# Patient Record
Sex: Female | Born: 1970 | Race: White | Hispanic: No | Marital: Single | Smoking: Former smoker
Health system: Southern US, Community
[De-identification: ages and names within clinical notes are randomized; demographics above are authoritative.]

## PROBLEM LIST (undated history)

## (undated) DIAGNOSIS — F329 Major depressive disorder, single episode, unspecified: Secondary | ICD-10-CM

## (undated) DIAGNOSIS — F32A Depression, unspecified: Secondary | ICD-10-CM

## (undated) DIAGNOSIS — F419 Anxiety disorder, unspecified: Secondary | ICD-10-CM

## (undated) HISTORY — PX: OTHER SURGICAL HISTORY: SHX169

---

## 1998-07-01 ENCOUNTER — Other Ambulatory Visit: Admission: RE | Admit: 1998-07-01 | Discharge: 1998-07-01 | Payer: Self-pay | Admitting: Obstetrics & Gynecology

## 1998-09-17 ENCOUNTER — Other Ambulatory Visit: Admission: RE | Admit: 1998-09-17 | Discharge: 1998-09-17 | Payer: Self-pay | Admitting: Obstetrics & Gynecology

## 1999-01-29 ENCOUNTER — Encounter: Payer: Self-pay | Admitting: Cardiology

## 1999-01-29 ENCOUNTER — Inpatient Hospital Stay (HOSPITAL_COMMUNITY): Admission: AD | Admit: 1999-01-29 | Discharge: 1999-01-30 | Payer: Self-pay

## 2000-03-07 ENCOUNTER — Encounter: Payer: Self-pay | Admitting: Emergency Medicine

## 2000-03-07 ENCOUNTER — Encounter: Admission: RE | Admit: 2000-03-07 | Discharge: 2000-03-07 | Payer: Self-pay | Admitting: Emergency Medicine

## 2000-07-17 ENCOUNTER — Other Ambulatory Visit: Admission: RE | Admit: 2000-07-17 | Discharge: 2000-07-17 | Payer: Self-pay | Admitting: Obstetrics and Gynecology

## 2000-08-07 ENCOUNTER — Other Ambulatory Visit: Admission: RE | Admit: 2000-08-07 | Discharge: 2000-08-07 | Payer: Self-pay | Admitting: Obstetrics and Gynecology

## 2000-08-09 ENCOUNTER — Other Ambulatory Visit: Admission: RE | Admit: 2000-08-09 | Discharge: 2000-08-09 | Payer: Self-pay | Admitting: Obstetrics and Gynecology

## 2000-08-09 ENCOUNTER — Encounter (INDEPENDENT_AMBULATORY_CARE_PROVIDER_SITE_OTHER): Payer: Self-pay | Admitting: Specialist

## 2001-04-10 ENCOUNTER — Encounter: Payer: Self-pay | Admitting: Emergency Medicine

## 2001-04-10 ENCOUNTER — Encounter: Admission: RE | Admit: 2001-04-10 | Discharge: 2001-04-10 | Payer: Self-pay | Admitting: Emergency Medicine

## 2001-08-27 ENCOUNTER — Other Ambulatory Visit: Admission: RE | Admit: 2001-08-27 | Discharge: 2001-08-27 | Payer: Self-pay | Admitting: Obstetrics and Gynecology

## 2001-11-26 ENCOUNTER — Other Ambulatory Visit: Admission: RE | Admit: 2001-11-26 | Discharge: 2001-11-26 | Payer: Self-pay | Admitting: Obstetrics and Gynecology

## 2001-11-28 ENCOUNTER — Encounter: Admission: RE | Admit: 2001-11-28 | Discharge: 2001-11-28 | Payer: Self-pay | Admitting: Obstetrics and Gynecology

## 2001-11-28 ENCOUNTER — Encounter: Payer: Self-pay | Admitting: Obstetrics and Gynecology

## 2005-04-28 ENCOUNTER — Other Ambulatory Visit: Admission: RE | Admit: 2005-04-28 | Discharge: 2005-04-28 | Payer: Self-pay | Admitting: Obstetrics and Gynecology

## 2005-11-10 ENCOUNTER — Other Ambulatory Visit: Admission: RE | Admit: 2005-11-10 | Discharge: 2005-11-10 | Payer: Self-pay | Admitting: Obstetrics and Gynecology

## 2012-04-04 ENCOUNTER — Emergency Department (HOSPITAL_BASED_OUTPATIENT_CLINIC_OR_DEPARTMENT_OTHER): Payer: 59

## 2012-04-04 ENCOUNTER — Encounter (HOSPITAL_BASED_OUTPATIENT_CLINIC_OR_DEPARTMENT_OTHER): Payer: Self-pay | Admitting: Emergency Medicine

## 2012-04-04 ENCOUNTER — Emergency Department (HOSPITAL_BASED_OUTPATIENT_CLINIC_OR_DEPARTMENT_OTHER)
Admission: EM | Admit: 2012-04-04 | Discharge: 2012-04-04 | Disposition: A | Discharge status: Home / Self Care | Payer: 59 | Attending: Emergency Medicine | Admitting: Emergency Medicine

## 2012-04-04 DIAGNOSIS — R1013 Epigastric pain: Secondary | ICD-10-CM | POA: Insufficient documentation

## 2012-04-04 DIAGNOSIS — Z87891 Personal history of nicotine dependence: Secondary | ICD-10-CM | POA: Insufficient documentation

## 2012-04-04 DIAGNOSIS — Z86718 Personal history of other venous thrombosis and embolism: Secondary | ICD-10-CM | POA: Insufficient documentation

## 2012-04-04 DIAGNOSIS — R0602 Shortness of breath: Secondary | ICD-10-CM | POA: Insufficient documentation

## 2012-04-04 LAB — BASIC METABOLIC PANEL
Calcium: 9.4 mg/dL (ref 8.4–10.5)
GFR calc Af Amer: >90 mL/min (ref >90)
GFR calc non Af Amer: >90 mL/min (ref >90)
Sodium: 141 mEq/L (ref 135–145)

## 2012-04-04 LAB — CBC
MCH: 30.6 pg (ref 26.0–34.0)
MCHC: 34.3 g/dL (ref 30.0–36.0)
Platelets: 157 10*3/uL (ref 150–400)

## 2012-04-04 LAB — TROPONIN I: Troponin I: <0.3 ng/mL (ref <0.30)

## 2012-04-04 LAB — D-DIMER, QUANTITATIVE: D-Dimer, Quant: <0.27 ug/mL-FEU (ref 0.00–0.48)

## 2012-04-04 MED ORDER — IBUPROFEN 400 MG PO TABS: 400.0000 mg | ORAL_TABLET | Freq: Four times a day (QID) | ORAL | Status: DC | PRN

## 2012-04-04 MED ORDER — IBUPROFEN 400 MG PO TABS
600.0000 mg | ORAL_TABLET | Freq: Once | ORAL | Status: AC
  Administered 2012-04-04: 600 mg via ORAL
  Filled 2012-04-04: qty 1

## 2012-04-04 NOTE — ED Notes (Signed)
Per EMS, pt picked up from work c/o Rt epigastric pain that began suddenly, sharp in character under Rt ribs that is worse w/ movement. Pt denies any cough, SHOB, N/V.  Pt reports recent stress at work. 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 20 G Lt AC. Pt on 2L O2.

## 2012-04-04 NOTE — ED Provider Notes (Signed)
History     CSN: [id number]  Arrival date & time 04/04/12  1013   First MD Initiated Contact with Patient 04/04/12 1032      Chief Complaint  Patient presents with  . Abdominal Pain    RUQ under Rt ribs    (Consider location/radiation/quality/duration/timing/severity/associated sxs/prior treatment) HPIAngela F Schroeder is a 41 y.o. female with a significant past medical history for a surface vein was thrombosed on her left rib cage for which she underwent Coumadin therapy for 6 months, this occurred about 10 years ago.  Patient was in [place] over the weekend, she drove there and back and then had the sudden onset of epigastric pain this morning. This pain is a sharp pain, was associated with some shortness of breath, it felt better when she was hunched over and hurt worse when she tried to arch her back, is worse on movement. Start up early this morning and then she went to meeting about 9:00 pain was about 6/10 at that point and now has a 4/10. It is worse on breathing and laughing. Patient denies any steroid therapy, no hemoptysis, no history of PE, no chest pain, no cough, no fevers, no chills, no nausea vomiting or diarrhea.  She's had an otherwise uneventful weekend in [place], she was site seen, she was walking around and had no pain in her calves, has no pain in her calves today. No swelling in either Either. Patient says the only thing that happened is that when he Karna Christmas turned on it lasted cold water which made her inspire deeply. History reviewed. No pertinent past medical history.  History reviewed. No pertinent past surgical history.  No family history on file.  History  Substance Use Topics  . Smoking status: Former Games developer  . Smokeless tobacco: Not on file  . Alcohol Use: Yes     Comment: occasionally    OB History    Grav Para Term Preterm Abortions TAB SAB Ect Mult Living                  Review of Systems At least 10pt or greater review of  systems completed and are negative except where specified in the HPI.  Allergies  Review of patient's allergies indicates no known allergies.  Home Medications  No current outpatient prescriptions on file.  BP 124/71  Pulse 60  Temp 97.4 F (36.3 C) (Oral)  Resp 16  Ht 5\' 1"  (1.549 m)  Wt 125 lb (56.7 kg)  BMI 23.62 kg/m2  SpO2 100%  Physical Exam  Nursing notes reviewed.  Electronic medical record reviewed. VITAL SIGNS:   Filed Vitals:   04/04/12 1018 04/04/12 1019 04/04/12 1235  BP: 124/71  117/64  Pulse: 60  58  Temp: 97.4 F (36.3 C)    TempSrc: Oral    Resp:  16 16  Height: 5\' 1"  (1.549 m)    Weight: 125 lb (56.7 kg)    SpO2: 100%  100%   CONSTITUTIONAL: Awake, oriented, appears non-toxic HENT: Atraumatic, normocephalic, oral mucosa pink and moist, airway patent. Nares patent without drainage. External ears normal. EYES: Conjunctiva clear, EOMI, PERRLA NECK: Trachea midline, non-tender, supple CARDIOVASCULAR: Normal heart rate, Normal rhythm, No murmurs, rubs, gallops PULMONARY/CHEST: Clear to auscultation, no rhonchi, wheezes, or rales. Symmetrical breath sounds. Non-tender. ABDOMINAL: Non-distended, soft, no rebound or guarding. Mild tenderness to palpation in the epigastrium when applying pressure directly inferior to the xiphoid process.  BS normal. NEUROLOGIC: Non-focal, moving all four extremities, no gross  sensory or motor deficits. EXTREMITIES: No clubbing, cyanosis, or edema. No calf tenderness to palpation. Negative Homans sign. SKIN: Warm, Dry, No erythema, No rash  ED Course  Procedures (including critical care time)  Date: 04/04/2012  Rate: 56  Rhythm: normal sinus rhythm  QRS Axis: normal  Intervals: normal  ST/T Wave abnormalities: normal  Conduction Disutrbances: none  Narrative Interpretation: unremarkable - normal sinus rhythm, no right heart strain, no ST or T wave abnormalities suggestive of ischemia or infarction    Labs Reviewed    CBC  BASIC METABOLIC PANEL  TROPONIN I  D-DIMER, QUANTITATIVE   Dg Chest 2 View  04/04/2012  *RADIOLOGY REPORT*  Clinical Data: Epigastric pain  CHEST - 2 VIEW  Comparison: None.  Findings: The heart and pulmonary vascularity are within normal limits.  The lungs are clear bilaterally.  No acute bony abnormality is noted.  IMPRESSION: No acute abnormalities seen.   Original Report Authenticated By: Alcide Clever, M.D.      1. Epigastric pain   2. Shortness of breath       MDM  Dawn Schroeder is a 41 y.o. female presenting with epigastric pain this pain seems to be in the chest wall or in the muscles attaching the xiphoid process and rib cage to the abdominal wall. I do not think this is a PE, do not think this is an atypical angina and is not suggestive of acute coronary syndrome. Patient did have a long car travel this weekend and does have a history of having a venous thrombosis treated with Coumadin, for these reasons I think it is worth to check a d-dimer to make sure that the patient does not have a PE. Again my pretest probability is low and with her negative d-dimer giving her a very low posttest probability.   Patient's EKG is unremarkable, she has a negative troponin, this chest pain started early this morning I expect some kind of drift, again my pretest probability for acute coronary syndrome in this patient is otherwise healthy is low. Chest x-ray is nonacute. Patient has no white count. Patient is much better after therapy here in the emergency department, given her stretching instruction stretch out the muscles of the abdominal wall and chest wall, she may take ibuprofen as needed for discomfort. She can followup in 2 days with her primary care physician. I explained the diagnosis and have given explicit precautions to return to the ER including worsening chest pain, persistent chest pain, chest pain associated with nausea vomiting, diaphoresis, shortness of breath or any other new  or worsening symptoms. The patient understands and accepts the medical plan as it's been dictated and I have answered their questions. Discharge instructions concerning home care and prescriptions have been given.  The patient is STABLE and is discharged to home in good condition.         Jones Skene, MD 04/04/12 1245

## 2012-05-18 ENCOUNTER — Telehealth: Payer: Self-pay | Admitting: Obstetrics and Gynecology

## 2012-05-18 NOTE — Telephone Encounter (Signed)
Tc to pt regarding msg.  Lm on vm to call back. 

## 2012-06-13 NOTE — Telephone Encounter (Signed)
No return call, encounter ended.

## 2014-06-02 ENCOUNTER — Ambulatory Visit
Admission: RE | Admit: 2014-06-02 | Discharge: 2014-06-02 | Disposition: A | Discharge status: Home / Self Care | Payer: BLUE CROSS/BLUE SHIELD | Source: Ambulatory Visit | Attending: Physician Assistant | Admitting: Physician Assistant

## 2014-06-02 ENCOUNTER — Other Ambulatory Visit: Payer: Self-pay | Admitting: Physician Assistant

## 2014-06-02 DIAGNOSIS — R52 Pain, unspecified: Secondary | ICD-10-CM

## 2017-02-24 ENCOUNTER — Other Ambulatory Visit: Payer: Self-pay | Admitting: Obstetrics and Gynecology

## 2017-03-17 NOTE — Patient Instructions (Addendum)
Your procedure is scheduled on:  Monday, 11/12  Enter through the Main Entrance of [facility] at: 7 am  Pick up the phone at the desk and dial [phone].  Call this number if you have problems the morning of surgery: [phone].  Remember: Do NOT eat food or drink clear liquids (including water) after Sunday  Take these medicines the morning of surgery with a SIP OF WATER:  Lamictal  Do NOT wear jewelry (body piercing), metal hair clips/bobby pins, make-up, or nail polish. Do NOT wear lotions, powders, or perfumes.  You may wear deoderant. Do NOT shave for 48 hours prior to surgery. Do NOT bring valuables to the hospital. Contacts may not be worn into surgery.  Leave suitcase in car.  After surgery it may be brought to your room.  For patients admitted to the hospital, checkout time is 11:00 AM the day of discharge. Have a responsible adult drive you home and stay with you for 24 hours after your procedure.  Home with Robert cell [phone].

## 2017-03-20 ENCOUNTER — Other Ambulatory Visit (HOSPITAL_COMMUNITY): Payer: BLUE CROSS/BLUE SHIELD

## 2017-03-20 ENCOUNTER — Encounter (HOSPITAL_COMMUNITY): Payer: Self-pay

## 2017-03-20 NOTE — H&P (Signed)
Dawn Schroeder is a 46 y.o. female,  P: 3-0-2-3, who presents for an anterior-posterior colporrhaphy and placement of tension free vaginal tape because of stress urinary incontinence.  Over the past year the patient has had worsening  urine leakage with coughing, laughing, lifting and bending.  She goes on to report post void dribbling and inability to hold her urine.  She denies and dysuria, history of renal stones, frequency or flank pain.  She has a Mirena IUD and therefore does not have a menstrual period.  A Lumax (urodynamic) study  ( done August 2018)  showed results consistent with stress urinary incontinence.  A review of both medical and surgical management options were given to the patient for management of her condition,  however she wishes to proceed with surgical management in the form of  an anterior-posterior colporrhaphy and placement of tension free vaginal tape .  Past Medical History  OB History: G: 5;  P: 3-0-2-3  SVB 1988, 1990 and 1988 with largest infant weighing 7 lbs. 7 ounces  GYN History: menarche: 46 YO    LMP: none due to Mirena IUD   The patient reports a past history of: gonorrhea, herpes and HPV.  Has a history of abnormal PAP smear that was treated with cryotherapy in 2002   Last PAP smear: 2018-normal  Medical History: Anemia, Migraines and Mood Disorder (treated with Lamictal)  Surgical History: 2002 Cervical Cryotherapy and 2016 Left Foot Surgery to Remove Morton's Neuroma Denies problems with anesthesia or history of blood transfusions  Family History:  COPD and Breast Cancer  Social History:  Single and employed in a warehouse;  Former smoker (quit 20 years ago) and occasional alcohol use   Medication: Lamictal 200 mg daily Multivitamin daily Mirena IUD placed 07/15/2016  No Known Allergies  Denies sensitivity to peanuts, shellfish, soy, latex or adhesives.   ROS: Admits to glasses, occasional migraine headaches but denies  vision changes, nasal  congestion, dysphagia, tinnitus, dizziness, hoarseness, cough,  chest pain, shortness of breath, nausea, vomiting, diarrhea,constipation,  urinary frequency,  dysuria, hematuria, vaginitis symptoms, pelvic pain, swelling of joints,easy bruising,  myalgias, arthralgias, skin rashes, unexplained weight loss and except as is mentioned in the history of present illness, patient's review of systems is otherwise negative.   Physical Exam  Bp:    120/70 P: 60 bpm    R: 16   Temperature: 98.5 degrees F orally  Weight: 145 lbs  Height: 5\' 1"   BMI: 27.4  Neck: supple without masses or thyromegaly Lungs: clear to auscultation Heart: regular rate and rhythm Abdomen: soft, non-tender and no organomegaly Pelvic:EGBUS- wnl; vagina-2/4 cystocele and 1/4 rectocele; uterus-normal  size, cervix without lesions or motion tenderness; adnexae-no tenderness or masses Extremities:  no clubbing, cyanosis or edema   Assesment: Stress Urinary Incontinence                       Symptomatic Cystocele and Rectocele   Disposition:  A discussion was held with patient regarding the indication for her procedure(s) along with the risks, which include but are not limited to reaction to anesthesia, damage to adjacent organs, infection, excessive bleeding and erosion of tension free vaginal tape. The patient verbalized understanding of these risks and has consented to proceed with an Draper and Placement of Tension Free Vaginal Tape at [facility] on March 27, 2017 at 8:30 a.m.  CSN# [id number]   [place] J. Florene Glen, PA-C  for Dr. Harvie Bridge.  Mancel Bale

## 2017-03-21 ENCOUNTER — Encounter (HOSPITAL_COMMUNITY)
Admission: RE | Admit: 2017-03-21 | Discharge: 2017-03-21 | Disposition: A | Discharge status: Home / Self Care | Payer: BLUE CROSS/BLUE SHIELD | Source: Ambulatory Visit | Attending: Obstetrics and Gynecology | Admitting: Obstetrics and Gynecology

## 2017-03-21 ENCOUNTER — Encounter (HOSPITAL_COMMUNITY): Payer: Self-pay

## 2017-03-21 ENCOUNTER — Other Ambulatory Visit: Payer: Self-pay

## 2017-03-21 DIAGNOSIS — N393 Stress incontinence (female) (male): Secondary | ICD-10-CM | POA: Diagnosis present

## 2017-03-21 HISTORY — DX: Major depressive disorder, single episode, unspecified: F32.9

## 2017-03-21 HISTORY — DX: Anxiety disorder, unspecified: F41.9

## 2017-03-21 HISTORY — DX: Depression, unspecified: F32.A

## 2017-03-21 LAB — CBC
HCT: 41.3 % (ref 36.0–46.0)
Hemoglobin: 13.8 g/dL (ref 12.0–15.0)
MCH: 30.3 pg (ref 26.0–34.0)
MCHC: 33.4 g/dL (ref 30.0–36.0)
MCV: 90.6 fL (ref 78.0–100.0)
PLATELETS: 183 10*3/uL (ref 150–400)
RBC: 4.56 MIL/uL (ref 3.87–5.11)
RDW: 12.5 % (ref 11.5–15.5)
WBC: 6.4 10*3/uL (ref 4.0–10.5)

## 2017-03-26 NOTE — Anesthesia Preprocedure Evaluation (Addendum)
Anesthesia Evaluation  Patient identified by MRN, date of birth, ID band Patient awake    Reviewed: Allergy & Precautions, NPO status , Patient's Chart, lab work & pertinent test results  History of Anesthesia Complications Negative for: history of anesthetic complications  Airway Mallampati: II  TM Distance: >3 FB Neck ROM: Full    Dental  (+) Dental Advisory Given   Pulmonary former smoker,    breath sounds clear to auscultation       Cardiovascular negative cardio ROS   Rhythm:Regular Rate:Normal     Neuro/Psych Anxiety Depression negative neurological ROS     GI/Hepatic negative GI ROS, Neg liver ROS,   Endo/Other  negative endocrine ROS  Renal/GU negative Renal ROS     Musculoskeletal negative musculoskeletal ROS (+)   Abdominal   Peds  Hematology negative hematology ROS (+) plt 183k   Anesthesia Other Findings   Reproductive/Obstetrics IUD                           Anesthesia Physical Anesthesia Plan  ASA: II  Anesthesia Plan: General   Post-op Pain Management:    Induction: Intravenous  PONV Risk Score and Plan: 4 or greater and Scopolamine patch - Pre-op, Dexamethasone, Ondansetron and Midazolam  Airway Management Planned: LMA  Additional Equipment:   Intra-op Plan:   Post-operative Plan:   Informed Consent: I have reviewed the patients History and Physical, chart, labs and discussed the procedure including the risks, benefits and alternatives for the proposed anesthesia with the patient or authorized representative who has indicated his/her understanding and acceptance.   Dental advisory given  Plan Discussed with: CRNA and Surgeon  Anesthesia Plan Comments: (Plan routine monitors, GA- LMA OK)        Anesthesia Quick Evaluation

## 2017-03-27 ENCOUNTER — Encounter (HOSPITAL_COMMUNITY)
Admission: RE | Disposition: A | Discharge status: Home / Self Care | Payer: Self-pay | Source: Ambulatory Visit | Attending: Obstetrics and Gynecology

## 2017-03-27 ENCOUNTER — Encounter (HOSPITAL_COMMUNITY): Payer: Self-pay

## 2017-03-27 ENCOUNTER — Ambulatory Visit (HOSPITAL_COMMUNITY): Payer: BLUE CROSS/BLUE SHIELD | Admitting: Anesthesiology

## 2017-03-27 ENCOUNTER — Observation Stay (HOSPITAL_COMMUNITY)
Admission: RE | Admit: 2017-03-27 | Discharge: 2017-03-28 | Disposition: A | Discharge status: Home / Self Care | Payer: BLUE CROSS/BLUE SHIELD | Source: Ambulatory Visit | Attending: Obstetrics and Gynecology | Admitting: Obstetrics and Gynecology

## 2017-03-27 ENCOUNTER — Other Ambulatory Visit: Payer: Self-pay

## 2017-03-27 DIAGNOSIS — N8110 Cystocele, unspecified: Secondary | ICD-10-CM | POA: Diagnosis not present

## 2017-03-27 DIAGNOSIS — F39 Unspecified mood [affective] disorder: Secondary | ICD-10-CM | POA: Insufficient documentation

## 2017-03-27 DIAGNOSIS — N393 Stress incontinence (female) (male): Secondary | ICD-10-CM | POA: Insufficient documentation

## 2017-03-27 DIAGNOSIS — Z87891 Personal history of nicotine dependence: Secondary | ICD-10-CM | POA: Insufficient documentation

## 2017-03-27 DIAGNOSIS — N8189 Other female genital prolapse: Secondary | ICD-10-CM | POA: Diagnosis present

## 2017-03-27 DIAGNOSIS — N816 Rectocele: Secondary | ICD-10-CM | POA: Diagnosis not present

## 2017-03-27 DIAGNOSIS — Z79899 Other long term (current) drug therapy: Secondary | ICD-10-CM | POA: Insufficient documentation

## 2017-03-27 HISTORY — PX: CYSTOSCOPY: SHX5120

## 2017-03-27 HISTORY — PX: ANTERIOR AND POSTERIOR REPAIR: SHX5121

## 2017-03-27 HISTORY — PX: BLADDER SUSPENSION: SHX72

## 2017-03-27 LAB — PREGNANCY, URINE: PREG TEST UR: NEGATIVE

## 2017-03-27 SURGERY — URETHROPEXY, USING TRANSVAGINAL TAPE
Anesthesia: General

## 2017-03-27 MED ORDER — MIDAZOLAM HCL 2 MG/2ML IJ SOLN
INTRAMUSCULAR | Status: AC
  Filled 2017-03-27: qty 2

## 2017-03-27 MED ORDER — ESTRADIOL 0.1 MG/GM VA CREA
TOPICAL_CREAM | VAGINAL | Status: AC
  Filled 2017-03-27: qty 42.5

## 2017-03-27 MED ORDER — PROPOFOL 10 MG/ML IV BOLUS
INTRAVENOUS | Status: AC
  Filled 2017-03-27: qty 20

## 2017-03-27 MED ORDER — MEPERIDINE HCL 25 MG/ML IJ SOLN
6.2500 mg | INTRAMUSCULAR | Status: DC | PRN
  Administered 2017-03-27: 6.25 mg via INTRAVENOUS

## 2017-03-27 MED ORDER — EPHEDRINE SULFATE 50 MG/ML IJ SOLN
INTRAMUSCULAR | Status: DC | PRN
  Administered 2017-03-27: 5 mg via INTRAVENOUS

## 2017-03-27 MED ORDER — FENTANYL CITRATE (PF) 100 MCG/2ML IJ SOLN
INTRAMUSCULAR | Status: DC | PRN
  Administered 2017-03-27: 25 ug via INTRAVENOUS
  Administered 2017-03-27 (×2): 50 ug via INTRAVENOUS
  Administered 2017-03-27: 25 ug via INTRAVENOUS
  Administered 2017-03-27: 50 ug via INTRAVENOUS

## 2017-03-27 MED ORDER — ONDANSETRON HCL 4 MG/2ML IJ SOLN
INTRAMUSCULAR | Status: DC | PRN
  Administered 2017-03-27: 4 mg via INTRAVENOUS

## 2017-03-27 MED ORDER — CEFAZOLIN SODIUM-DEXTROSE 2-4 GM/100ML-% IV SOLN
2.0000 g | INTRAVENOUS | Status: AC
  Administered 2017-03-27: 2 g via INTRAVENOUS

## 2017-03-27 MED ORDER — STERILE WATER FOR IRRIGATION IR SOLN
Status: DC | PRN
  Administered 2017-03-27: 1000 mL via INTRAVESICAL

## 2017-03-27 MED ORDER — LACTATED RINGERS IV SOLN
INTRAVENOUS | Status: DC
  Administered 2017-03-27 (×2): via INTRAVENOUS

## 2017-03-27 MED ORDER — DIPHENHYDRAMINE HCL 50 MG/ML IJ SOLN: 12.5000 mg | Freq: Four times a day (QID) | INTRAMUSCULAR | Status: DC | PRN

## 2017-03-27 MED ORDER — SCOPOLAMINE 1 MG/3DAYS TD PT72
1.0000 | MEDICATED_PATCH | Freq: Once | TRANSDERMAL | Status: DC
  Administered 2017-03-27: 1.5 mg via TRANSDERMAL

## 2017-03-27 MED ORDER — FENTANYL CITRATE (PF) 100 MCG/2ML IJ SOLN
INTRAMUSCULAR | Status: AC
  Filled 2017-03-27: qty 2

## 2017-03-27 MED ORDER — HYDROMORPHONE HCL 1 MG/ML IJ SOLN: 0.2500 mg | INTRAMUSCULAR | Status: DC | PRN

## 2017-03-27 MED ORDER — DEXAMETHASONE SODIUM PHOSPHATE 4 MG/ML IJ SOLN
INTRAMUSCULAR | Status: AC
  Filled 2017-03-27: qty 1

## 2017-03-27 MED ORDER — NALOXONE HCL 0.4 MG/ML IJ SOLN: 0.4000 mg | INTRAMUSCULAR | Status: DC | PRN

## 2017-03-27 MED ORDER — OXYCODONE-ACETAMINOPHEN 5-325 MG PO TABS
1.0000 | ORAL_TABLET | ORAL | Status: DC | PRN
  Administered 2017-03-27 – 2017-03-28 (×3): 1 via ORAL
  Filled 2017-03-27: qty 2
  Filled 2017-03-27 (×3): qty 1

## 2017-03-27 MED ORDER — PROMETHAZINE HCL 25 MG/ML IJ SOLN: 6.2500 mg | INTRAMUSCULAR | Status: DC | PRN

## 2017-03-27 MED ORDER — DEXAMETHASONE SODIUM PHOSPHATE 10 MG/ML IJ SOLN
INTRAMUSCULAR | Status: DC | PRN
  Administered 2017-03-27: 4 mg via INTRAVENOUS

## 2017-03-27 MED ORDER — KETOROLAC TROMETHAMINE 30 MG/ML IJ SOLN
INTRAMUSCULAR | Status: DC | PRN
  Administered 2017-03-27: 30 mg via INTRAVENOUS

## 2017-03-27 MED ORDER — DIPHENHYDRAMINE HCL 12.5 MG/5ML PO ELIX: 12.5000 mg | ORAL_SOLUTION | Freq: Four times a day (QID) | ORAL | Status: DC | PRN

## 2017-03-27 MED ORDER — VASOPRESSIN 20 UNIT/ML IV SOLN
INTRAVENOUS | Status: DC | PRN
  Administered 2017-03-27: 40 mL via INTRAMUSCULAR

## 2017-03-27 MED ORDER — LAMOTRIGINE 200 MG PO TABS
200.0000 mg | ORAL_TABLET | Freq: Every day | ORAL | Status: DC
  Administered 2017-03-28: 200 mg via ORAL
  Filled 2017-03-27 (×2): qty 1

## 2017-03-27 MED ORDER — EPHEDRINE 5 MG/ML INJ
INTRAVENOUS | Status: AC
  Filled 2017-03-27: qty 10

## 2017-03-27 MED ORDER — SCOPOLAMINE 1 MG/3DAYS TD PT72
MEDICATED_PATCH | TRANSDERMAL | Status: AC
  Administered 2017-03-27: 1.5 mg via TRANSDERMAL
  Filled 2017-03-27: qty 1

## 2017-03-27 MED ORDER — ONDANSETRON HCL 4 MG/2ML IJ SOLN: 4.0000 mg | Freq: Four times a day (QID) | INTRAMUSCULAR | Status: DC | PRN

## 2017-03-27 MED ORDER — MIDAZOLAM HCL 2 MG/2ML IJ SOLN: 0.5000 mg | Freq: Once | INTRAMUSCULAR | Status: DC | PRN

## 2017-03-27 MED ORDER — PROPOFOL 10 MG/ML IV BOLUS
INTRAVENOUS | Status: DC | PRN
  Administered 2017-03-27: 160 mg via INTRAVENOUS

## 2017-03-27 MED ORDER — MENTHOL 3 MG MT LOZG: 1.0000 | LOZENGE | OROMUCOSAL | Status: DC | PRN

## 2017-03-27 MED ORDER — LACTATED RINGERS IV SOLN
INTRAVENOUS | Status: DC
  Administered 2017-03-27 (×2): via INTRAVENOUS

## 2017-03-27 MED ORDER — ONDANSETRON HCL 4 MG PO TABS: 4.0000 mg | ORAL_TABLET | Freq: Three times a day (TID) | ORAL | Status: DC | PRN

## 2017-03-27 MED ORDER — ESTRADIOL 0.1 MG/GM VA CREA
TOPICAL_CREAM | VAGINAL | Status: DC | PRN
  Administered 2017-03-27: 1 via VAGINAL

## 2017-03-27 MED ORDER — MIDAZOLAM HCL 2 MG/2ML IJ SOLN
INTRAMUSCULAR | Status: DC | PRN
  Administered 2017-03-27: 2 mg via INTRAVENOUS

## 2017-03-27 MED ORDER — VASOPRESSIN 20 UNIT/ML IV SOLN
INTRAVENOUS | Status: AC
  Filled 2017-03-27: qty 1

## 2017-03-27 MED ORDER — CEFAZOLIN SODIUM-DEXTROSE 2-3 GM-%(50ML) IV SOLR
INTRAVENOUS | Status: AC
  Filled 2017-03-27: qty 50

## 2017-03-27 MED ORDER — MEPERIDINE HCL 25 MG/ML IJ SOLN
INTRAMUSCULAR | Status: AC
  Administered 2017-03-27: 6.25 mg via INTRAVENOUS
  Filled 2017-03-27: qty 1

## 2017-03-27 MED ORDER — LIDOCAINE HCL (CARDIAC) 20 MG/ML IV SOLN
INTRAVENOUS | Status: DC | PRN
  Administered 2017-03-27: 100 mg via INTRAVENOUS

## 2017-03-27 MED ORDER — HYDROMORPHONE 1 MG/ML IV SOLN
INTRAVENOUS | Status: DC
  Administered 2017-03-27: 14:00:00 via INTRAVENOUS
  Administered 2017-03-27: 0 mg via INTRAVENOUS
  Administered 2017-03-27: 0.4 mg via INTRAVENOUS
  Filled 2017-03-27 (×2): qty 25

## 2017-03-27 MED ORDER — ONDANSETRON HCL 4 MG/2ML IJ SOLN
INTRAMUSCULAR | Status: AC
  Filled 2017-03-27: qty 2

## 2017-03-27 MED ORDER — SODIUM CHLORIDE 0.9 % IJ SOLN
INTRAMUSCULAR | Status: AC
  Filled 2017-03-27: qty 100

## 2017-03-27 MED ORDER — DOCUSATE SODIUM 100 MG PO CAPS
100.0000 mg | ORAL_CAPSULE | Freq: Two times a day (BID) | ORAL | Status: DC
  Administered 2017-03-27: 100 mg via ORAL
  Filled 2017-03-27: qty 1

## 2017-03-27 MED ORDER — IBUPROFEN 600 MG PO TABS
600.0000 mg | ORAL_TABLET | Freq: Four times a day (QID) | ORAL | Status: DC | PRN
  Administered 2017-03-27 – 2017-03-28 (×2): 600 mg via ORAL
  Filled 2017-03-27 (×2): qty 1

## 2017-03-27 MED ORDER — LIDOCAINE HCL (CARDIAC) 20 MG/ML IV SOLN
INTRAVENOUS | Status: AC
  Filled 2017-03-27: qty 5

## 2017-03-27 MED ORDER — KETOROLAC TROMETHAMINE 30 MG/ML IJ SOLN
INTRAMUSCULAR | Status: AC
  Filled 2017-03-27: qty 1

## 2017-03-27 MED ORDER — SODIUM CHLORIDE 0.9% FLUSH: 9.0000 mL | INTRAVENOUS | Status: DC | PRN

## 2017-03-27 SURGICAL SUPPLY — 46 items
ADH SKN CLS APL DERMABOND .7 (GAUZE/BANDAGES/DRESSINGS) ×1
BLADE SURG 11 STRL SS (BLADE) ×1 IMPLANT
BLADE SURG 15 STRL LF C SS BP (BLADE) ×1 IMPLANT
BLADE SURG 15 STRL SS (BLADE) ×3
CANISTER SUCT 3000ML PPV (MISCELLANEOUS) ×3 IMPLANT
CATH FOLEY 2WAY SLVR  5CC 18FR (CATHETERS) ×2
CATH FOLEY 2WAY SLVR 30CC 16FR (CATHETERS) ×2 IMPLANT
CATH FOLEY 2WAY SLVR 5CC 18FR (CATHETERS) ×1 IMPLANT
CLOTH BEACON ORANGE TIMEOUT ST (SAFETY) ×3 IMPLANT
CONT PATH 16OZ SNAP LID 3702 (MISCELLANEOUS) IMPLANT
DECANTER SPIKE VIAL GLASS SM (MISCELLANEOUS) IMPLANT
DERMABOND ADVANCED (GAUZE/BANDAGES/DRESSINGS) ×2
DERMABOND ADVANCED .7 DNX12 (GAUZE/BANDAGES/DRESSINGS) ×1 IMPLANT
DRAPE SHEET LG 3/4 BI-LAMINATE (DRAPES) ×6 IMPLANT
GAUZE PACKING 2X5 YD STRL (GAUZE/BANDAGES/DRESSINGS) ×3 IMPLANT
GAUZE SPONGE 4X4 16PLY XRAY LF (GAUZE/BANDAGES/DRESSINGS) ×2 IMPLANT
GLOVE BIO SURGEON STRL SZ7.5 (GLOVE) ×3 IMPLANT
GLOVE BIOGEL PI IND STRL 7.0 (GLOVE) ×1 IMPLANT
GLOVE BIOGEL PI IND STRL 7.5 (GLOVE) ×1 IMPLANT
GLOVE BIOGEL PI INDICATOR 7.0 (GLOVE) ×2
GLOVE BIOGEL PI INDICATOR 7.5 (GLOVE) ×2
GOWN STRL REUS W/TWL LRG LVL3 (GOWN DISPOSABLE) ×12 IMPLANT
NEEDLE HYPO 22GX1.5 SAFETY (NEEDLE) ×3 IMPLANT
NS IRRIG 1000ML POUR BTL (IV SOLUTION) ×3 IMPLANT
PACK VAGINAL WOMENS (CUSTOM PROCEDURE TRAY) ×3 IMPLANT
SET CYSTO W/LG BORE CLAMP LF (SET/KITS/TRAYS/PACK) ×3 IMPLANT
SLING TRANS VAGINAL TAPE (Sling) ×2 IMPLANT
SLING UTERINE/ABD GYNECARE TVT (Sling) ×2 IMPLANT
SUT MNCRL AB 3-0 PS2 27 (SUTURE) IMPLANT
SUT MNCRL AB 4-0 PS2 18 (SUTURE) IMPLANT
SUT VIC AB 0 CT1 18XCR BRD8 (SUTURE) IMPLANT
SUT VIC AB 0 CT1 27 (SUTURE) ×3
SUT VIC AB 0 CT1 27XBRD ANBCTR (SUTURE) ×1 IMPLANT
SUT VIC AB 0 CT1 8-18 (SUTURE)
SUT VIC AB 1 CT1 36 (SUTURE) IMPLANT
SUT VIC AB 2-0 CT1 27 (SUTURE) ×12
SUT VIC AB 2-0 CT1 TAPERPNT 27 (SUTURE) ×4 IMPLANT
SUT VIC AB 2-0 SH 27 (SUTURE) ×18
SUT VIC AB 2-0 SH 27XBRD (SUTURE) ×8 IMPLANT
SUT VIC AB 3-0 SH 27 (SUTURE)
SUT VIC AB 3-0 SH 27X BRD (SUTURE) IMPLANT
TOWEL OR 17X24 6PK STRL BLUE (TOWEL DISPOSABLE) ×6 IMPLANT
TRAY FOLEY CATH SILVER 14FR (SET/KITS/TRAYS/PACK) ×3 IMPLANT
TUBING NON-CON 1/4 X 20 CONN (TUBING) ×1 IMPLANT
TUBING NON-CON 1/4 X 20' CONN (TUBING) ×1
YANKAUER SUCT BULB TIP NO VENT (SUCTIONS) ×4 IMPLANT

## 2017-03-27 NOTE — Anesthesia Postprocedure Evaluation (Signed)
Anesthesia Post Note  Patient: DYNA FIGUEREO  Procedure(s) Performed: TRANSVAGINAL TAPE (TVT) PROCEDURE (N/A ) ANTERIOR (CYSTOCELE) AND POSTERIOR REPAIR (RECTOCELE) (N/A ) CYSTOSCOPY (N/A )     Patient location during evaluation: PACU Anesthesia Type: General Level of consciousness: awake and alert, patient cooperative and oriented Pain management: pain level controlled Vital Signs Assessment: post-procedure vital signs reviewed and stable Respiratory status: spontaneous breathing, nonlabored ventilation and respiratory function stable Cardiovascular status: blood pressure returned to baseline and stable Postop Assessment: no apparent nausea or vomiting Anesthetic complications: no    Last Vitals:  Vitals:   03/27/17 1445 03/27/17 1557  BP: (!) 97/51 (!) 97/56  Pulse: 72 78  Resp: 16 16  Temp: 36.6 C 36.8 C  SpO2: 96% 100%    Last Pain:  Vitals:   03/27/17 1445  TempSrc: Oral  PainSc:    Pain Goal: Patients Stated Pain Goal: 2 (03/27/17 1400)               Darly Massi,E. Taffany Heiser

## 2017-03-27 NOTE — Anesthesia Postprocedure Evaluation (Signed)
Anesthesia Post Note  Patient: Dawn Schroeder  Procedure(s) Performed: TRANSVAGINAL TAPE (TVT) PROCEDURE (N/A ) ANTERIOR (CYSTOCELE) AND POSTERIOR REPAIR (RECTOCELE) (N/A ) CYSTOSCOPY (N/A )     Patient location during evaluation: [facility] Anesthesia Type: General Level of consciousness: awake, awake and alert, oriented and patient cooperative Pain management: pain level controlled Vital Signs Assessment: post-procedure vital signs reviewed and stable Respiratory status: spontaneous breathing, nonlabored ventilation, respiratory function stable and patient connected to nasal cannula oxygen Cardiovascular status: stable Postop Assessment: no apparent nausea or vomiting Anesthetic complications: no    Last Vitals:  Vitals:   03/27/17 1557 03/27/17 1811  BP: (!) 97/56   Pulse: 78   Resp: 16 14  Temp: 36.8 C   SpO2: 100% 100%    Last Pain:  Vitals:   03/27/17 1811  TempSrc:   PainSc: 4    Pain Goal: Patients Stated Pain Goal: 2 (03/27/17 1811)               Blake Vetrano L

## 2017-03-27 NOTE — Transfer of Care (Signed)
Immediate Anesthesia Transfer of Care Note  Patient: Dawn Schroeder  Procedure(s) Performed: TRANSVAGINAL TAPE (TVT) PROCEDURE (N/A ) ANTERIOR (CYSTOCELE) AND POSTERIOR REPAIR (RECTOCELE) (N/A ) CYSTOSCOPY (N/A )  Patient Location: PACU  Anesthesia Type:General  Level of Consciousness: awake, alert  and oriented  Airway & Oxygen Therapy: Patient Spontanous Breathing and Patient connected to nasal cannula oxygen  Post-op Assessment: Report given to RN, Post -op Vital signs reviewed and stable and Patient moving all extremities  Post vital signs: Reviewed and stable  Last Vitals:  Vitals:   03/27/17 0616  BP: 127/77  Pulse: 64  Resp: 16  Temp: 36.7 C  SpO2: 100%    Last Pain:  Vitals:   03/27/17 0616  TempSrc: Oral      Patients Stated Pain Goal: 4 (31/51/76 1607)  Complications: No apparent anesthesia complications

## 2017-03-27 NOTE — OR Nursing (Addendum)
Called MD at [facility], left message. MD returned call at [phone]. OR and staff ready to proceed with surgery.

## 2017-03-27 NOTE — Interval H&P Note (Signed)
History and Physical Interval Note:  03/27/2017 8:53 AM  Dawn Schroeder  has presented today for surgery, with the diagnosis of Female Stress Incontinence - 2.5 Hours  The various methods of treatment have been discussed with the patient and family. After consideration of risks, benefits and other options for treatment, the patient has consented to  Procedure(s): TRANSVAGINAL TAPE (TVT) PROCEDURE (N/A) ANTERIOR (CYSTOCELE) AND POSTERIOR REPAIR (RECTOCELE) (N/A) as a surgical intervention .  The patient's history has been reviewed, patient examined, no change in status, stable for surgery.  I have reviewed the patient's chart and labs.  Questions were answered to the patient's satisfaction.     [name],[name]  Correction on RN note:  Called by OR at 8:20.  Call returned at 8:23.  No changes in H&P.

## 2017-03-27 NOTE — Addendum Note (Signed)
Addendum  created 03/27/17 1924 by Raenette Rover, CRNA   Sign clinical note

## 2017-03-27 NOTE — Op Note (Addendum)
Preop Diagnosis: 1.SUI 2.Symptomatic Cystocele and Rectocele  Postop Diagnosis: 1.SUI 2.Cystocele and Rectocele  Procedure: 1.TVT 2.ANTERIOR AND POSTERIOR REPAIR 2.CYSTOSCOPY3 Anesthesia: General   Attending: Everett Graff, MD   Assistant: Earnstine Regal, PA-C  Findings: Cystocele and rectocele  Pathology: N/a  Fluids: 1500 cc  UOP: 50 cc  EBL: 099 cc  Complications: None  Procedure: The patient was taken to the operating room after the risks, benefits and alternatives were discussed with the patient, the patient verbalized understanding and consent signed and witnessed. A timeout was performed per protocol and patient identified as Dawn Schroeder and TVT/A-P Repair/Cystoscopy as the planned procedure. The patient was prepped and draped in the normal sterile fashion in the dorsal lithotomy position and the patient was placed under general anesthesia per the anesthesiologist. A weighted speculum was placed in the patient's vagina and the anterior vaginal wall was retracted using Deaver retractors. The anterior vaginal wall was injected with dilute pitressin (20u in 50 cc) and the anterior vaginal wall was then incised and dissected away from the underlying layer of tissue. The cystocele was repaired with a purse string stitch using 2-0 vicryl. The excess vaginal wall tissue was excised.  Attention was turned to the anterior vaginal wall beneath the urethra and dilute pitressin at a concentration of 20 units of pitressin in a total of 50cc of normal saline was injected in the anterior vaginal wall.  An incision was made in the anterior wall of the vagina for approximately 1cm beneath the midurethra and the underlying tissue was dissected away from the anterior vaginal wall down to the level of the lower symphysis pubis bilaterally. Attention was then turned to the mons pubis where two 5 mm incisions were made 2 fingerbreadths from the midline. The transabdominal guide was then  passed through the mons pubis incision on the patient's right down through the space of Retzius and out through the anterior vaginal wall after deflecting the rigid urethral catheter guide to the ipsilateral side. The same was done on the contralateral side. Cystoscopy was performed and no invadvertant bladder injury was noted. The bladder was drained with a Foley while deflecting the rigid urethral catheter guide to the patient's right and the mesh was attached to the transabdominal guide and elevated up through the space of Retzius and out through the incision on the mons pubis on the ipsilateral side. The same was done on the contralateral side. Cystoscopy was performed again and no inadvertant bladder injury was noted and bilateral ureters were noted to efflux without difficulty. The 41 French Foley was left in the urethra and a large Claiborne Billings was placed between the urethra and the mesh in order to leave the mesh slack beneath the midurethra.  The anterior vaginal wall was repaired with 2-0 vicry interrupted stitches beneath the urethra and an interlocking stitch of 2-0 vicryl over the bladder.  Attention was then turned to the posterior vaginal wall where dilute pitressin was injected. The posterior vaginal wall was incised and the underlying tissue was dissected away from the posterior vaginal wall. The rectocele was repaired using plication stitches of 2-0 Vicryl. Excess posterior vaginal wall tissue was excised and the posterior vaginal wall repaired with 2-0 vicryl via a running interlocking stitch. The perineum was repaired with 2-0 Vicryl via a subcuticular stitch.  The vagina was packed with estrogen-soaked packing. The patient tolerated the procedure well and was awaiting return to the recovery room in good condition.

## 2017-03-27 NOTE — Anesthesia Procedure Notes (Signed)
Procedure Name: LMA Insertion Date/Time: 03/27/2017 9:20 AM Performed by: Hewitt Blade, CRNA Pre-anesthesia Checklist: Patient identified, Emergency Drugs available, Suction available and Patient being monitored Patient Re-evaluated:Patient Re-evaluated prior to induction Oxygen Delivery Method: Circle system utilized Preoxygenation: Pre-oxygenation with 100% oxygen Induction Type: IV induction LMA: LMA inserted LMA Size: 3.0 Number of attempts: 1 Placement Confirmation: positive ETCO2 and breath sounds checked- equal and bilateral Tube secured with: Tape Dental Injury: Teeth and Oropharynx as per pre-operative assessment

## 2017-03-28 DIAGNOSIS — N8110 Cystocele, unspecified: Secondary | ICD-10-CM | POA: Diagnosis not present

## 2017-03-28 LAB — CBC
HEMATOCRIT: 32.6 % — AB (ref 36.0–46.0)
HEMOGLOBIN: 10.6 g/dL — AB (ref 12.0–15.0)
MCH: 30.4 pg (ref 26.0–34.0)
MCHC: 32.5 g/dL (ref 30.0–36.0)
MCV: 93.4 fL (ref 78.0–100.0)
Platelets: 172 10*3/uL (ref 150–400)
RBC: 3.49 MIL/uL — AB (ref 3.87–5.11)
RDW: 12.5 % (ref 11.5–15.5)
WBC: 11.3 10*3/uL — ABNORMAL HIGH (ref 4.0–10.5)

## 2017-03-28 MED ORDER — IBUPROFEN 600 MG PO TABS: ORAL_TABLET | ORAL | 1 refills | Status: DC

## 2017-03-28 MED ORDER — DOCUSATE SODIUM 100 MG PO CAPS
100.0000 mg | ORAL_CAPSULE | Freq: Two times a day (BID) | ORAL | Status: DC
  Administered 2017-03-28: 100 mg via ORAL
  Filled 2017-03-28: qty 1

## 2017-03-28 MED ORDER — LACTATED RINGERS IV SOLN: INTRAVENOUS | Status: DC

## 2017-03-28 MED ORDER — CIPROFLOXACIN HCL 250 MG PO TABS: 250.0000 mg | ORAL_TABLET | Freq: Two times a day (BID) | ORAL | 0 refills | Status: AC

## 2017-03-28 MED ORDER — ONDANSETRON HCL 4 MG PO TABS: 4.0000 mg | ORAL_TABLET | Freq: Three times a day (TID) | ORAL | Status: DC | PRN

## 2017-03-28 MED ORDER — ESTROGENS, CONJUGATED 0.625 MG/GM VA CREA: 0.5000 g | TOPICAL_CREAM | VAGINAL | 0 refills | Status: AC

## 2017-03-28 MED ORDER — OXYCODONE-ACETAMINOPHEN 5-325 MG PO TABS: ORAL_TABLET | ORAL | 0 refills | Status: DC

## 2017-03-28 NOTE — Progress Notes (Signed)
Vaginal packing removed atraumatically. Patient tolerated well. Vaginal tape noted to be less than 50% saturated along it's entire length. Small amount of vaginal bleeding noted after removal of packing. Pericare with assist up to BR. Pt washing off. Encouraged to wait until after breakfast to shower.

## 2017-03-28 NOTE — Progress Notes (Addendum)
Dawn Schroeder is a46 y.o.  [id number]  Post Op Date # 1:  Anterior-Posterior Colporrhaphy and Placement of Tension Free Vaginal Tape  Subjective: Patient is Doing well postoperatively. Patient has Pain is controlled with current analgesics. Medications being used: prescription NSAID's including Ibuprofen 600 mg and narcotic analgesics including Percocet 5/325 mg.. Ambulating in the halls without difficulty, tolerating a regular diet but hasn't voided since Foley was removed 1.5 hours ago.   Objective: Vital signs in last 24 hours: Temp:  [97.5 F (36.4 C)-98.3 F (36.8 C)] 98.3 F (36.8 C) (11/13 0409) Pulse Rate:  [62-78] 68 (11/13 0409) Resp:  [12-20] 16 (11/13 0409) BP: (84-107)/(45-63) 94/46 (11/13 0409) SpO2:  [95 %-100 %] 97 % (11/13 0409) Weight:  [145 lb 6 oz (65.9 kg)] 145 lb 6 oz (65.9 kg) (11/12 1245)  Intake/Output from previous day: 11/12 0701 - 11/13 0700 In: 3000 [I.V.:3000] Out: 2125 [Urine:2000] Intake/Output this shift: No intake/output data recorded. Recent Labs  Lab 03/21/17 0825  WBC 6.4  HGB 13.8  HCT 41.3  PLT 183    No results for input(s): NA, K, CL, CO2, BUN, CREATININE, CALCIUM, PROT, BILITOT, ALKPHOS, ALT, AST, GLUCOSE in the last 168 hours.  Invalid input(s): LABALBU  EXAM: General: alert, cooperative, no distress and sitting in chair. Resp: clear to auscultation bilaterally Cardio: regular rate and rhythm, S1, S2 normal, no murmur, click, rub or gallop GI: Bowel sounds present, soft Extremities: Homans sign is negative, no sign of DVT and no calf tenderness. Vaginal Bleeding: minimal and perineal pad with faint dry diffuse stain.   Assessment: s/p Procedure(s): TRANSVAGINAL TAPE (TVT) PROCEDURE ANTERIOR (CYSTOCELE) AND POSTERIOR REPAIR (RECTOCELE) CYSTOSCOPY: stable, progressing well and tolerating diet  Plan: Awaiting patient to void  Routine Care Possible discharge home today  LOS: 0 days    POWELL,ELMIRA, PA-C 03/28/2017  7:35 AM  Voiding spont without difficulty and feels like she is emptying.  Tolerating reg diet and ambulating without difficulty. D/C instructions reviewed.  F/u in 6wks.

## 2017-03-28 NOTE — Discharge Summary (Signed)
Physician Discharge Summary  Patient ID: Dawn Schroeder MRN: [id number] DOB/AGE: [birth year] 46 y.o.  Admit date: 03/27/2017 Discharge date: 03/28/2017   Discharge Diagnoses:  Stress Urinary Incontinence, Symptomatic Cystocele and Rectocele Active Problems:   Pelvic relaxation   Operation: Anterior-Posterior Colporrhaphy with Placement of Tension Free Vaginal Tape   Discharged Condition: Good  Hospital Course: On the date of admission the patient underwent the aforementioned procedures and tolerated them well.  Post operative course was unremarkable with the patient resuming bowel and bladder function by post operative day #1 and was therefore deemed ready for discharge home.  Discharge hemoglobin was:  Disposition: 01-Home or Self Care  Discharge Medications:  Allergies as of 03/28/2017   No Known Allergies     Medication List    TAKE these medications   ibuprofen 600 MG tablet Commonly known as:  ADVIL,MOTRIN 1 po pc every 6 hours for 5 days then as needed for pain What changed:    medication strength  how much to take  how to take this  when to take this  reasons to take this  additional instructions  Another medication with the same name was removed. Continue taking this medication, and follow the directions you see here.   lamoTRIgine 200 MG tablet Commonly known as:  LAMICTAL   levonorgestrel 20 MCG/24HR IUD Commonly known as:  MIRENA 1 each once by Intrauterine route. Mirena IUD inserted 09/2016   oxyCODONE-acetaminophen 5-325 MG tablet Commonly known as:  PERCOCET/ROXICET 1  po every 6 hours as needed for post operative pain   Premarin Vaginal Cream as prescribed Ciprofloxacin 250mg  every 12hrs for 3 days     Follow-up: Dr. Harvie Bridge. Mancel Bale on April 10, 2017 at 8:45 a.m.     SignedEarnstine Regal, PA-C 03/28/2017, 7:54 AM

## 2017-03-28 NOTE — Discharge Instructions (Signed)
Anterior and Posterior Colporrhaphy and Sling Procedure, Care After This sheet gives you information about how to care for yourself after your procedure. Your health care provider may also give you more specific instructions. If you have problems or questions, contact your health care provider. What can I expect after the procedure? After the procedure, it is common to have:  Pain in the surgical area.  Vaginal discharge. You will need to use a sanitary pad during this time.  Fatigue.  Follow these instructions at home: Incision care  Follow instructions from your health care provider about how to take care of your incision. Make sure you: ? Wash your hands with soap and water before touching the incision area. If soap and water are not available, use hand sanitizer. ? Clean your incision as told by your health care provider. ? Leave stitches (sutures), skin glue, or adhesive strips in place. These skin closures may need to stay in place for 2 weeks or longer. If adhesive strip edges start to loosen and curl up, you may trim the loose edges. Do not remove adhesive strips completely unless your health care provider tells you to do that.  Check your incision area every day for signs of infection. Check for: ? Redness, swelling, or pain. ? Fluid or blood. ? Warmth. ? Pus or a bad smell.  Check your incision every day to make sure the incision area is not separating or opening.  Do not take baths, swim, or use a hot tub until your health care provider approves. You may shower.  Keep the area between your vagina and rectum (perineal area) clean and dry. Make sure you clean the area after each bowel movement and each time you urinate.  Ask your health care provider if you can take a sitz bath or sit in a tub of clean, warm water. Activity  Do gentle, daily activity as told by your health care provider. You may be told to take short walks every day and go farther each time. Ask your health  care provider what activities are safe for you.  Limit stair climbing to once or twice a day in the first week, then slowly increase this activity.  Do not lift anything that is heavier than 10 lbs. (4.5 kg), or the limit that your health care provider tells you, until he or she says that it is safe. Avoid pushing or pulling motions.  Avoid standing for long periods of time.  Do not douche, use tampons, or have sex until your health care provider says it is okay.  Do not drive or use heavy machinery while taking prescription pain medicine. To prevent constipation  To prevent or treat constipation while you are taking prescription pain medicine, your health care provider may recommend that you: ? Take over-the-counter or prescription medicines. ? Eat foods that are high in fiber, such as fresh fruits and vegetables, whole grains, and beans. ? Drink enough fluid to keep your urine clear or pale yellow. ? Limit foods that are high in fat and processed sugars, such as fried and sweet foods. General instructions  You may be instructed to do pelvic floor exercises (kegels) as told by your health care provider.  Take over-the-counter and prescription medicines only as told by your health care provider.  Keep all follow-up visits as told by your health care provider. This is important. Contact a health care provider if:  Medicine does not help your pain.  You have frequent or urgent urination, or  you are unable to completely empty your bladder.  You feel a burning sensation when urinating.  You have fluid or blood coming from your incision.  You have pus or a bad smell coming from the incision.  Your incision feels warm to the touch.  You have redness, swelling, or pain around your incision. Get help right away if:  You have a fever or chills.  Your incision separates or opens.  You cannot urinate.  You have trouble breathing. Summary  After the procedure, it is common to  have pain, fatigue, and discharge from the vagina.  Keep the area between your vagina and rectum (perineal area) clean and dry. Make sure you clean the area after each bowel movement and each time you urinate.  Follow instructions from your health care provider on any activity restrictions after the procedure. This information is not intended to replace advice given to you by your health care provider. Make sure you discuss any questions you have with your health care provider. Document Released: 12/15/2003 Document Revised: 05/02/2016 Document Reviewed: 05/02/2016 Elsevier Interactive Patient Education  2017 [facility] @ [phone] if:  You have a temperature greater than or equal to 100.4 degrees Farenheit orally You have pain that is not made better by the pain medication given and taken as directed You have excessive bleeding or problems urinating  Take Colace (Docusate Sodium/Stool Softener) 100 mg 2-3 times daily while taking narcotic pain medicine to avoid constipation or until bowel movements are regular. Take Ibuprofen 600 mg with food every 6 hours for 5 days then as needed for pain  You may drive after 1 week You may walk up steps  You may shower tomorrow You may resume a regular diet  Keep incisions clean and dry  Do not lift over 15 pounds for 6 weeks Avoid anything in vagina for 6 weeks (or until after your post-operative visit)

## 2017-03-28 NOTE — Plan of Care (Signed)
Patient given instruction in plan of care and activity for post op. Assisted with ambulation. Aware the activity level will increase as she recovers from her surgery. Tolerating food and beverages without nausea. Pain well controlled with PCA. Transition to PO meds after tolerates a meal.

## 2017-04-21 ENCOUNTER — Encounter (HOSPITAL_COMMUNITY): Payer: Self-pay | Admitting: Obstetrics and Gynecology

## 2019-02-12 ENCOUNTER — Encounter: Payer: Self-pay | Admitting: Internal Medicine

## 2019-03-19 ENCOUNTER — Ambulatory Visit (AMBULATORY_SURGERY_CENTER): Payer: BC Managed Care – PPO | Admitting: *Deleted

## 2019-03-19 ENCOUNTER — Other Ambulatory Visit: Payer: Self-pay

## 2019-03-19 VITALS — Temp 96.9°F | Ht 61.0 in | Wt 139.8 lb

## 2019-03-19 DIAGNOSIS — Z1211 Encounter for screening for malignant neoplasm of colon: Secondary | ICD-10-CM

## 2019-03-19 DIAGNOSIS — Z1159 Encounter for screening for other viral diseases: Secondary | ICD-10-CM

## 2019-03-19 MED ORDER — NA SULFATE-K SULFATE-MG SULF 17.5-3.13-1.6 GM/177ML PO SOLN: 1.0000 | Freq: Once | ORAL | 0 refills | Status: AC

## 2019-03-19 NOTE — Progress Notes (Signed)
No egg or soy allergy known to patient  No issues with past sedation with any surgeries  or procedures, no intubation problems  No diet pills per patient No home 02 use per patient  No blood thinners per patient  Pt denies issues with constipation  No A fib or A flutter  EMMI video sent to pt's e mail   Due to the COVID-19 pandemic we are asking patients to follow these guidelines. Please only bring one care partner. Please be aware that your care partner may wait in the car in the parking lot or if they feel like they will be too hot to wait in the car, they may wait in the lobby on the 4th floor. All care partners are required to wear a mask the entire time (we do not have any that we can provide them), they need to practice social distancing, and we will do a Covid check for all patient's and care partners when you arrive. Also we will check their temperature and your temperature. If the care partner waits in their car they need to stay in the parking lot the entire time and we will call them on their cell phone when the patient is ready for discharge so they can bring the car to the front of the building. Also all patient's will need to wear a mask into building.    COVID SCREENING 03/28/19 3:15 PM  SUPREP COUPON PROVIDED

## 2019-03-20 ENCOUNTER — Encounter: Payer: Self-pay | Admitting: Internal Medicine

## 2019-03-28 ENCOUNTER — Ambulatory Visit (INDEPENDENT_AMBULATORY_CARE_PROVIDER_SITE_OTHER): Payer: BC Managed Care – PPO

## 2019-03-28 ENCOUNTER — Other Ambulatory Visit: Payer: Self-pay | Admitting: Internal Medicine

## 2019-03-28 DIAGNOSIS — Z1159 Encounter for screening for other viral diseases: Secondary | ICD-10-CM

## 2019-03-29 LAB — SARS CORONAVIRUS 2 (TAT 6-24 HRS): SARS Coronavirus 2: NEGATIVE

## 2019-04-02 ENCOUNTER — Ambulatory Visit (AMBULATORY_SURGERY_CENTER): Payer: BC Managed Care – PPO | Admitting: Internal Medicine

## 2019-04-02 ENCOUNTER — Encounter: Payer: Self-pay | Admitting: Internal Medicine

## 2019-04-02 ENCOUNTER — Other Ambulatory Visit: Payer: Self-pay

## 2019-04-02 VITALS — BP 130/78 | HR 59 | Temp 98.9°F | Resp 17 | Ht 61.0 in | Wt 139.0 lb

## 2019-04-02 DIAGNOSIS — Z1211 Encounter for screening for malignant neoplasm of colon: Secondary | ICD-10-CM | POA: Diagnosis not present

## 2019-04-02 MED ORDER — SODIUM CHLORIDE 0.9 % IV SOLN: 500.0000 mL | Freq: Once | INTRAVENOUS | Status: DC

## 2019-04-02 NOTE — Progress Notes (Signed)
A/ox3, pleased with MAC, report to RN 

## 2019-04-02 NOTE — Op Note (Signed)
[place] Patient Name: Dawn Schroeder Procedure Date: 04/02/2019 7:55 AM MRN: [id number] Endoscopist: Docia Chuck. Henrene Pastor , MD Age: 48 Referring MD:  Date of Birth: 1970-12-01 Gender: Female Account #: [id number] Procedure:                Colonoscopy Indications:              Screening for colorectal malignant neoplasm Medicines:                Monitored Anesthesia Care Procedure:                Pre-Anesthesia Assessment:                           - Prior to the procedure, a History and Physical                            was performed, and patient medications and                            allergies were reviewed. The patient's tolerance of                            previous anesthesia was also reviewed. The risks                            and benefits of the procedure and the sedation                            options and risks were discussed with the patient.                            All questions were answered, and informed consent                            was obtained. Prior Anticoagulants: The patient has                            taken no previous anticoagulant or antiplatelet                            agents. ASA Grade Assessment: II - A patient with                            mild systemic disease. After reviewing the risks                            and benefits, the patient was deemed in                            satisfactory condition to undergo the procedure.                           After obtaining informed consent, the colonoscope  was passed under direct vision. Throughout the                            procedure, the patient's blood pressure, pulse, and                            oxygen saturations were monitored continuously. The                            Colonoscope was introduced through the anus and                            advanced to the the cecum, identified by                            appendiceal orifice and  ileocecal valve. The                            ileocecal valve, appendiceal orifice, and rectum                            were photographed. The quality of the bowel                            preparation was excellent. The colonoscopy was                            performed without difficulty. The patient tolerated                            the procedure well. The bowel preparation used was                            SUPREP via split dose instruction. Scope In: 8:20:04 AM Scope Out: 8:36:14 AM Scope Withdrawal Time: 0 hours 13 minutes 14 seconds  Total Procedure Duration: 0 hours 16 minutes 10 seconds  Findings:                 There was a tiny nonbleeding AVM in the right                            colon. Very mild early diverticular changes in the                            sigmoid colon. The exam was otherwise without                            abnormality on direct and retroflexion views. Complications:            No immediate complications. Estimated blood loss:                            None. Estimated Blood Loss:     Estimated blood loss: none. Impression:               -  Incidental tiny right colon AVM and very early                            sigmoid diverticulosis                           - The examination was otherwise normal on direct                            and retroflexion views.                           - No specimens collected. Recommendation:           - Repeat colonoscopy in 10 years for screening                            purposes.                           - Patient has a contact number available for                            emergencies. The signs and symptoms of potential                            delayed complications were discussed with the                            patient. Return to normal activities tomorrow.                            Written discharge instructions were provided to the                            patient.                            - Resume previous diet.                           - Continue present medications. Docia Chuck. Henrene Pastor, MD 04/02/2019 8:46:02 AM This report has been signed electronically.

## 2019-04-02 NOTE — Progress Notes (Signed)
Pt's states no medical or surgical changes since previsit or office visit. 

## 2019-04-02 NOTE — Patient Instructions (Signed)
Repeat colonoscopy in 10 years for screening purposes.  YOU HAD AN ENDOSCOPIC PROCEDURE TODAY AT [facility]:   Refer to the procedure report that was given to you for any specific questions about what was found during the examination.  If the procedure report does not answer your questions, please call your gastroenterologist to clarify.  If you requested that your care partner not be given the details of your procedure findings, then the procedure report has been included in a sealed envelope for you to review at your convenience later.  YOU SHOULD EXPECT: Some feelings of bloating in the abdomen. Passage of more gas than usual.  Walking can help get rid of the air that was put into your GI tract during the procedure and reduce the bloating. If you had a lower endoscopy (such as a colonoscopy or flexible sigmoidoscopy) you may notice spotting of blood in your stool or on the toilet paper. If you underwent a bowel prep for your procedure, you may not have a normal bowel movement for a few days.  Please Note:  You might notice some irritation and congestion in your nose or some drainage.  This is from the oxygen used during your procedure.  There is no need for concern and it should clear up in a day or so.  SYMPTOMS TO REPORT IMMEDIATELY:   Following lower endoscopy (colonoscopy or flexible sigmoidoscopy):  Excessive amounts of blood in the stool  Significant tenderness or worsening of abdominal pains  Swelling of the abdomen that is new, acute  Fever of 100F or higher   For urgent or emergent issues, a gastroenterologist can be reached at any hour by calling [phone].   DIET:  We do recommend a small meal at first, but then you may proceed to your regular diet.  Drink plenty of fluids but you should avoid alcoholic beverages for 24 hours.  ACTIVITY:  You should plan to take it easy for the rest of today and you should NOT DRIVE or use heavy machinery until tomorrow  (because of the sedation medicines used during the test).    FOLLOW UP: Our staff will call the number listed on your records 48-72 hours following your procedure to check on you and address any questions or concerns that you may have regarding the information given to you following your procedure. If we do not reach you, we will leave a message.  We will attempt to reach you two times.  During this call, we will ask if you have developed any symptoms of COVID 19. If you develop any symptoms (ie: fever, flu-like symptoms, shortness of breath, cough etc.) before then, please call [phone].  If you test positive for Covid 19 in the 2 weeks post procedure, please call and report this information to [place].    If any biopsies were taken you will be contacted by phone or by letter within the next 1-3 weeks.  Please call us at [phone] if you have not heard about the biopsies in 3 weeks.    SIGNATURES/CONFIDENTIALITY: You and/or your care partner have signed paperwork which will be entered into your electronic medical record.  These signatures attest to the fact that that the information above on your After Visit Summary has been reviewed and is understood.  Full responsibility of the confidentiality of this discharge information lies with you and/or your care-partner.

## 2019-04-04 ENCOUNTER — Telehealth: Payer: Self-pay

## 2019-04-04 NOTE — Telephone Encounter (Signed)
LVM

## 2019-04-04 NOTE — Telephone Encounter (Signed)
1st follow up call made.  NALM 

## 2019-04-09 ENCOUNTER — Encounter: Payer: BLUE CROSS/BLUE SHIELD | Admitting: Internal Medicine

## 2020-03-10 ENCOUNTER — Ambulatory Visit: Payer: BC Managed Care – PPO | Admitting: Podiatry

## 2020-12-01 ENCOUNTER — Ambulatory Visit: Payer: BC Managed Care – PPO | Admitting: Podiatry

## 2020-12-10 ENCOUNTER — Ambulatory Visit (INDEPENDENT_AMBULATORY_CARE_PROVIDER_SITE_OTHER): Payer: BC Managed Care – PPO | Admitting: Podiatry

## 2020-12-10 ENCOUNTER — Other Ambulatory Visit: Payer: Self-pay

## 2020-12-10 ENCOUNTER — Ambulatory Visit (INDEPENDENT_AMBULATORY_CARE_PROVIDER_SITE_OTHER): Payer: BC Managed Care – PPO

## 2020-12-10 DIAGNOSIS — D3610 Benign neoplasm of peripheral nerves and autonomic nervous system, unspecified: Secondary | ICD-10-CM

## 2020-12-13 NOTE — Progress Notes (Signed)
  Subjective:  Patient ID: Dawn Schroeder, female    DOB: [birth year],  MRN: [id number]  Chief Complaint  Patient presents with   Neuroma       np - neuroma    50 y.o. female presents with the above complaint. History confirmed with patient.  This is in her right third interspace, she had the same thing in her left foot previously had surgery in 2015 and then eliminate all her symptoms.  She would like to have it excised before he gets to bed.  Objective:  Physical Exam: warm, good capillary refill, no trophic changes or ulcerative lesions, normal DP and PT pulses, and normal sensory exam. Left Foot: Well-healed surgical scar third interspace Right Foot: Pain on palpation of the plantar third interspace with radiation into toes    Radiographs: Multiple views x-ray of the right foot: no fracture, dislocation, swelling or degenerative changes noted Assessment:   1. Neuroma      Plan:  Patient was evaluated and treated and all questions answered.  Discussed treatments including injection and surgical excision.  She would like to proceed with surgical excision.  I am ordering MRI to evaluate this neuroma for surgical planning.  I will see her back in a few months and plan for surgery towards the end of the year.  Return in about 10 weeks (around 02/18/2021) for surgery planning visit for neuroma .

## 2020-12-29 ENCOUNTER — Other Ambulatory Visit: Payer: Self-pay

## 2020-12-29 ENCOUNTER — Ambulatory Visit
Admission: RE | Admit: 2020-12-29 | Discharge: 2020-12-29 | Disposition: A | Discharge status: Home / Self Care | Payer: BC Managed Care – PPO | Source: Ambulatory Visit | Attending: Podiatry | Admitting: Podiatry

## 2020-12-29 DIAGNOSIS — D3610 Benign neoplasm of peripheral nerves and autonomic nervous system, unspecified: Secondary | ICD-10-CM

## 2021-02-18 ENCOUNTER — Ambulatory Visit: Payer: BC Managed Care – PPO | Admitting: Podiatry

## 2021-03-09 ENCOUNTER — Ambulatory Visit: Payer: BC Managed Care – PPO | Admitting: Podiatry

## 2021-11-10 ENCOUNTER — Ambulatory Visit (INDEPENDENT_AMBULATORY_CARE_PROVIDER_SITE_OTHER): Payer: 59 | Admitting: Podiatry

## 2021-11-10 ENCOUNTER — Ambulatory Visit (INDEPENDENT_AMBULATORY_CARE_PROVIDER_SITE_OTHER): Payer: 59

## 2021-11-10 DIAGNOSIS — D3613 Benign neoplasm of peripheral nerves and autonomic nervous system of lower limb, including hip: Secondary | ICD-10-CM

## 2021-11-10 DIAGNOSIS — D3610 Benign neoplasm of peripheral nerves and autonomic nervous system, unspecified: Secondary | ICD-10-CM | POA: Diagnosis not present

## 2021-11-13 NOTE — Progress Notes (Signed)
  Subjective:  Patient ID: Dawn Schroeder, female    DOB: 1970/12/09,  MRN: [id number]  Chief Complaint  Patient presents with   Foot Pain    Right foot surgery consult     51 y.o. female presents with the above complaint. History confirmed with patient.  She is scheduled for neuroma surgery Wynetta Emery completed the MRI but did not come back to schedule and would like to have surgery at this point    Objective:  Physical Exam: warm, good capillary refill, no trophic changes or ulcerative lesions, normal DP and PT pulses, and normal sensory exam. 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     Electronically Signed   By: Maurine Simmering M.D.   On: 12/30/2020 15:32   Assessment:   1. Neuroma      Plan:  Patient was evaluated and treated and all questions answered.  We again discussed treatment options including excision of the neuroma.  She understands and wishes to proceed.  We discussed the risk benefits and potential complications of the procedure include not limited to pain, swelling, infection, scar, numbness which may be temporary or permanent, chronic pain, stiffness, nerve pain or damage, wound healing problems.  All questions were addressed.  No guarantees as the outcome of the procedure were given.  Surgery be scheduled for this fall.   Surgical plan:  Procedure: -Right third interspace neuroma excision  Location: -[facility]  Anesthesia plan: -IV sedation with local block  Postoperative pain plan: - Tylenol 1000 mg every 6 hours, ibuprofen 600 mg every 6 hours, gabapentin 300 mg every 8 hours x5 days, oxycodone 5 mg  1-2 tabs every 6 hours only as needed  DVT prophylaxis: -None required  WB Restrictions / DME needs: -WBAT in CAM boot    No follow-ups on file.

## 2022-02-09 IMAGING — MR MR FOOT*R* W/O CM
4 of 6 series · 21 of 40 positions shown · non-contrast
Comparison: Radiograph 11/10/2020

CLINICAL DATA: Foot pain, chronic, Nalut\LESYA92\s neuroma suspected,
xray done

EXAM:
MRI OF THE RIGHT FOREFOOT WITHOUT CONTRAST
TECHNIQUE: Multiplanar, multisequence MR imaging of the right foot was
performed. No intravenous contrast was administered.

[Series 4: T1 · coronal · 3.0mm · 0.19mm/px · 3 of 44 slices shown]
[im 6/44]
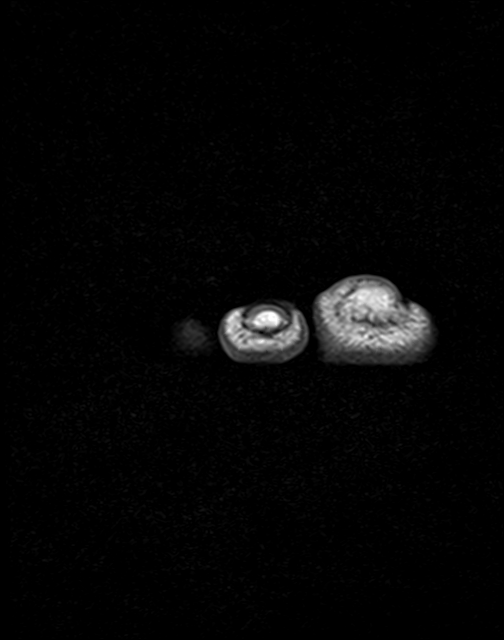
[im 22/44]
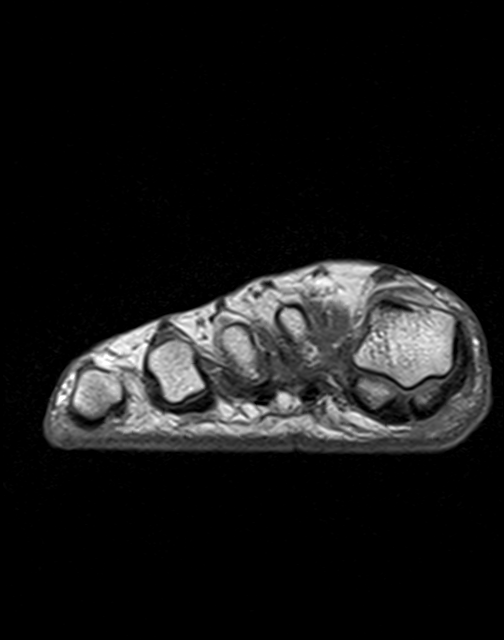
[im 38/44]
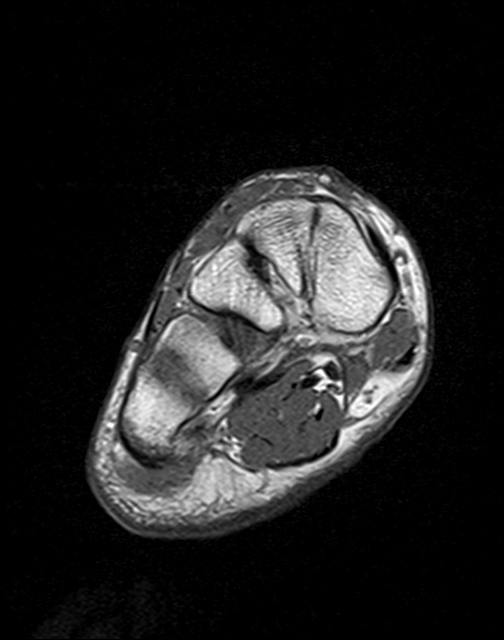

[Series 5: T2 fat-sat · coronal · 3.0mm · 0.19mm/px · 9 of 44 slices shown (1 of 2)]
[im 1/44]
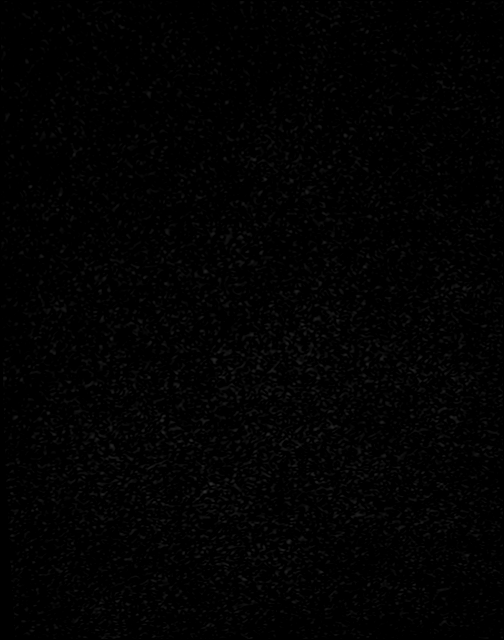
[im 6/44]
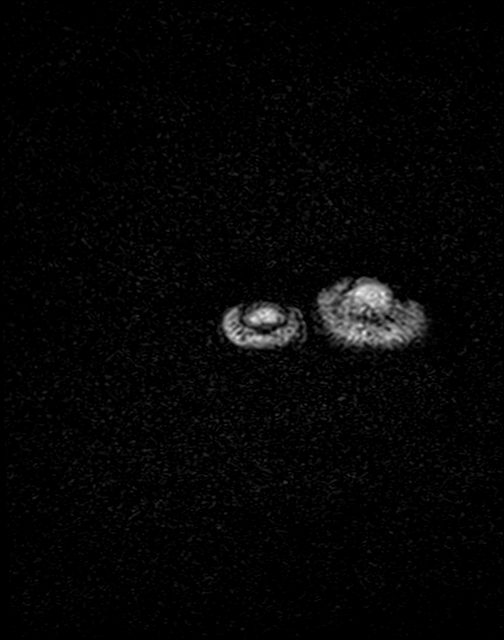
[im 11/44]
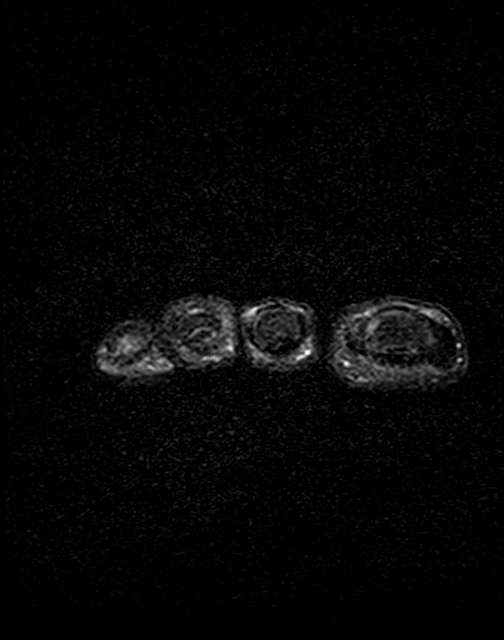
[im 17/44]
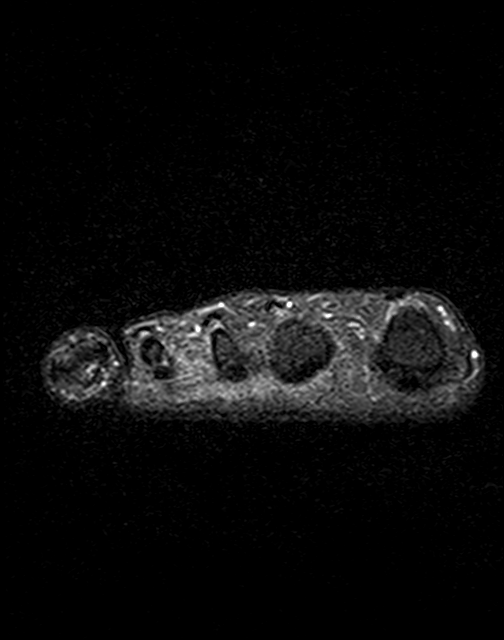
[im 22/44]
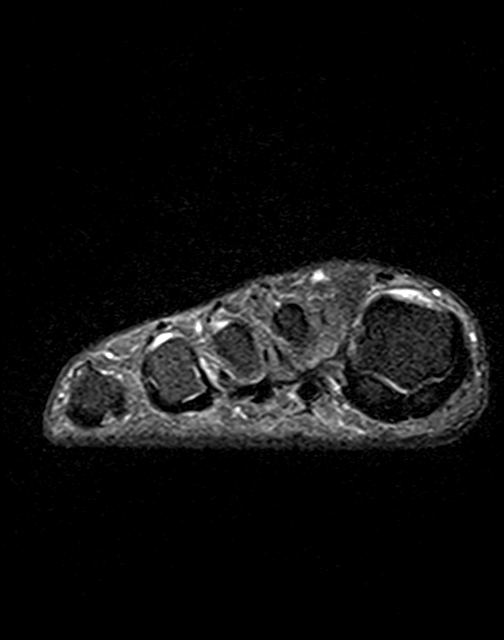
[im 27/44]
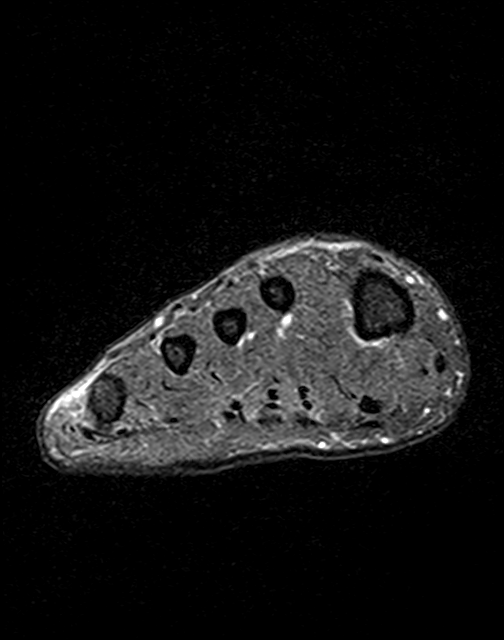
[im 33/44]
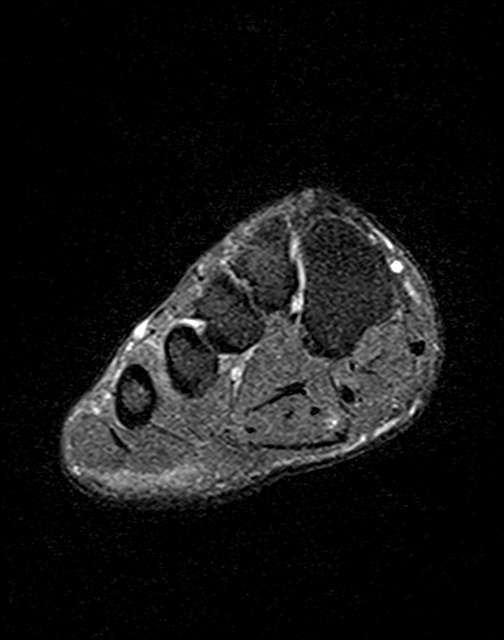
[im 38/44]
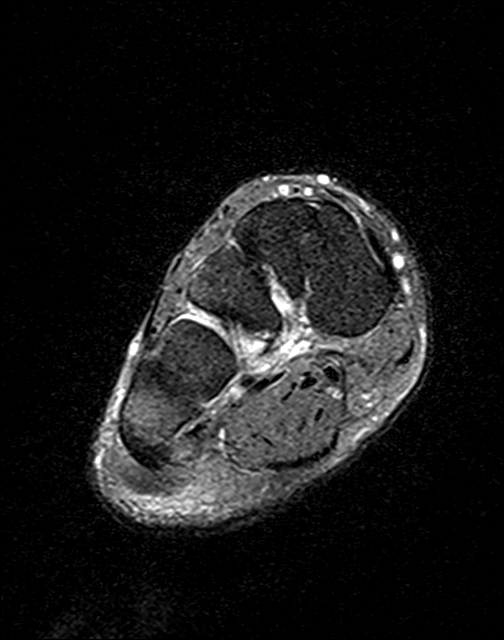
[im 44/44]
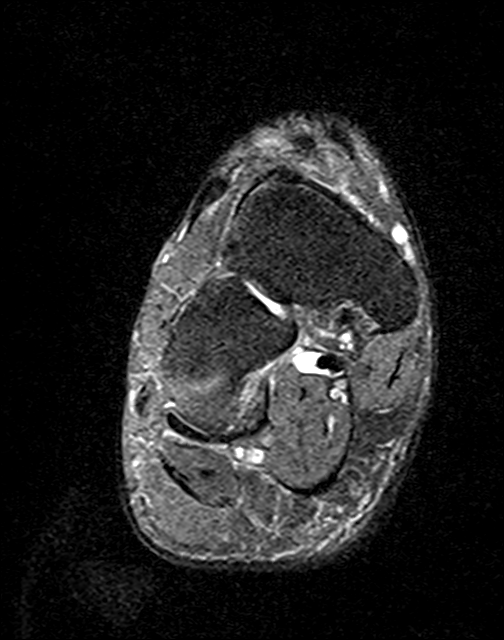

[Series 6: T2 fat-sat · axial · 3.0mm · 0.35mm/px · z∈[-124,-46]mm · 4 of 22 slices shown (2 of 2)]
[im 1/22]
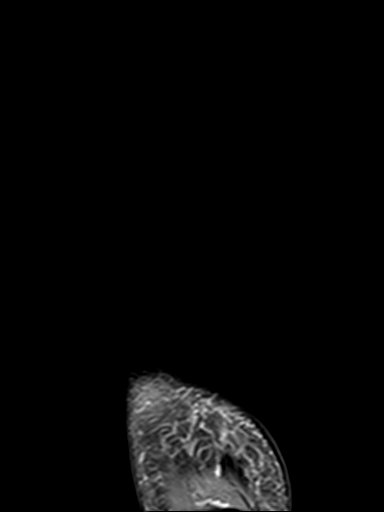
[im 8/22]
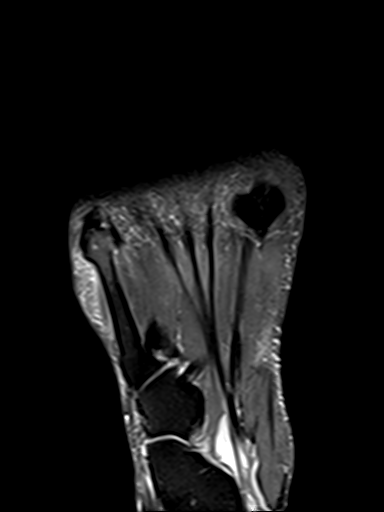
[im 15/22]
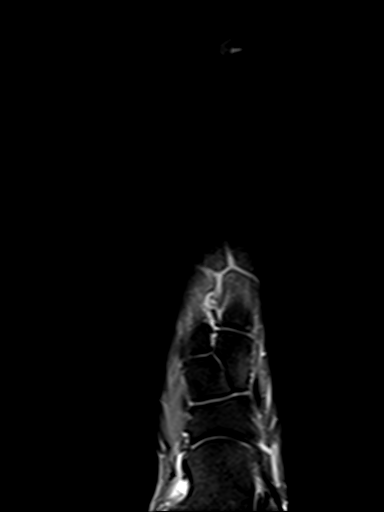
[im 22/22]
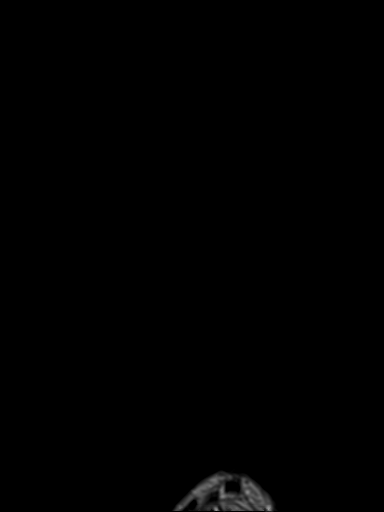

[Series 7: PD fat-sat · sagittal · 3.0mm · 0.44mm/px · 5 of 28 slices shown]
[im 1/28]
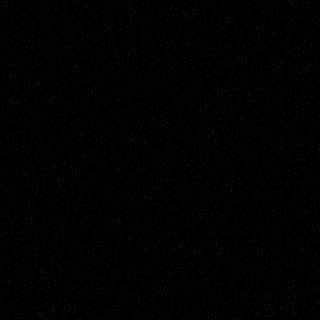
[im 7/28]
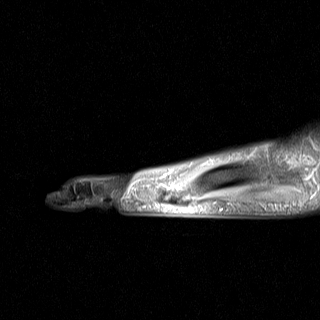
[im 14/28]
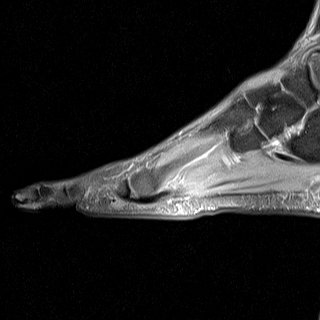
[im 21/28]
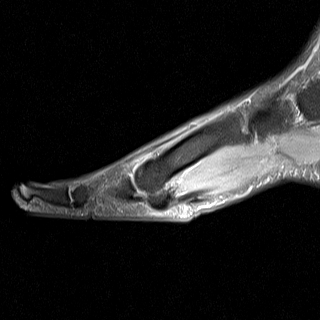
[im 28/28]
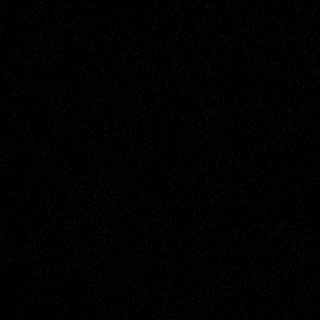

[21 of 40 positions shown; findings below may reference images not displayed]

FINDINGS: Bones/Joint/Cartilage

There is no evidence of fracture. The cortex is intact. There is no
significant marrow signal alteration.

Ligaments

Intact Lisfranc ligament.  No evidence of plantar plate tear

Muscles and Tendons

No significant muscle atrophy. No acute tendon tear in the forefoot.

Soft tissues

There is a small plantar intermetatarsal neuroma measuring 0.9 x
cm in the third webspace between the third and fourth metatarsal
heads (coronal T1 image 26). There is mild associated edema.
IMPRESSION: Small intermetatarsal neuroma in the third webspace between the
third and fourth metatarsal heads, measuring 0.9 x 0.4 cm.

## 2022-02-18 ENCOUNTER — Encounter: Payer: Self-pay | Admitting: Podiatry

## 2022-03-01 ENCOUNTER — Telehealth: Payer: Self-pay | Admitting: Podiatry

## 2022-03-01 NOTE — Telephone Encounter (Signed)
DOS: 04/01/2022  [facility]  Procedure: Neurectomy Rt [phone]) DX: G57.61  Deductible: $2,000 with $0 remaining. Out-of-Pocket: $4,500 with $2,208.91 remaining CoInsurance: 20%  Notification or Prior Authorization is not required for the requested services  This [employer] plan does not currently require a prior authorization for these services. If you have general questions about the prior authorization requirements, please call us at [phone] or visit [email or web address] > Clinician Resources > Advance and Admission Notification Requirements. The number above acknowledges your notification. Please write this number down for future reference. Notification is not a guarantee of coverage or payment.  Decision ID #:[id number]  The number above acknowledges your inquiry and our response. Please write this number down and refer to it for future inquiries. Coverage and payment for an item or service is governed by the member's benefit plan document, and, if applicable, the provider's participation agreement with the Health Plan.

## 2022-04-01 ENCOUNTER — Other Ambulatory Visit: Payer: Self-pay | Admitting: Podiatry

## 2022-04-01 DIAGNOSIS — G5761 Lesion of plantar nerve, right lower limb: Secondary | ICD-10-CM | POA: Diagnosis not present

## 2022-04-01 MED ORDER — OXYCODONE HCL 5 MG PO TABS: 5.0000 mg | ORAL_TABLET | ORAL | 0 refills | Status: AC | PRN

## 2022-04-01 MED ORDER — IBUPROFEN 600 MG PO TABS: 600.0000 mg | ORAL_TABLET | Freq: Four times a day (QID) | ORAL | 0 refills | Status: AC | PRN

## 2022-04-01 MED ORDER — GABAPENTIN 300 MG PO CAPS: 300.0000 mg | ORAL_CAPSULE | Freq: Three times a day (TID) | ORAL | 0 refills | Status: AC

## 2022-04-01 MED ORDER — ACETAMINOPHEN 500 MG PO TABS: 1000.0000 mg | ORAL_TABLET | Freq: Four times a day (QID) | ORAL | 0 refills | Status: AC | PRN

## 2022-04-01 NOTE — Progress Notes (Signed)
11/17 right foot neurectomy

## 2022-04-12 ENCOUNTER — Ambulatory Visit (INDEPENDENT_AMBULATORY_CARE_PROVIDER_SITE_OTHER): Payer: 59 | Admitting: Podiatry

## 2022-04-12 DIAGNOSIS — D3610 Benign neoplasm of peripheral nerves and autonomic nervous system, unspecified: Secondary | ICD-10-CM

## 2022-04-13 NOTE — Progress Notes (Signed)
  Subjective:  Patient ID: Dawn Schroeder, female    DOB: 09-Jun-1970,  MRN: [id number]  Chief Complaint  Patient presents with   Routine Post Op    POV #1 DOS 04/01/2022 REMOVAL OF NEUROMA LT FOOT     51 y.o. female returns for post-op check.  Overall she is doing well she is having very little pain.  Review of Systems: Negative except as noted in the HPI. Denies N/V/F/Ch.   Objective:  There were no vitals filed for this visit. There is no height or weight on file to calculate BMI. Constitutional Well developed. Well nourished.  Vascular Foot warm and well perfused. Capillary refill normal to all digits.  Calf is soft and supple, no posterior calf or knee pain, negative Homans' sign  Neurologic Normal speech. Oriented to person, place, and time. Epicritic sensation to light touch grossly present bilaterally.  Dermatologic Skin healing well without signs of infection. Skin edges well coapted without signs of infection.  Orthopedic: She has little to no tenderness to palpation noted about the surgical site.   Assessment:   1. Neuroma    Plan:  Patient was evaluated and treated and all questions answered.  S/p foot surgery right -Overall doing very well.  I removed her sutures today and applied Steri-Strips to reinforce the incision.  She may resume regular bathing.  She will return in 2 weeks for nursing visit to check on her incision and ensure that there is full healing.  I will see her as needed after this  Return in about 2 weeks (around 04/26/2022) for post op (no x-rays).

## 2022-04-26 ENCOUNTER — Encounter: Payer: 59 | Admitting: Podiatry

## 2022-05-17 ENCOUNTER — Encounter: Payer: 59 | Admitting: Podiatry
